# Patient Record
Sex: Female | Born: 2000 | Race: Black or African American | Hispanic: No | Marital: Single | Smoking: Never smoker
Health system: Southern US, Community
[De-identification: ages and names within clinical notes are randomized; demographics above are authoritative.]

## PROBLEM LIST (undated history)

## (undated) DIAGNOSIS — L309 Dermatitis, unspecified: Secondary | ICD-10-CM

## (undated) HISTORY — DX: Dermatitis, unspecified: L30.9

---
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
## 2021-01-17 DIAGNOSIS — Z419 Encounter for procedure for purposes other than remedying health state, unspecified: Secondary | ICD-10-CM | POA: Diagnosis not present

## 2021-02-16 DIAGNOSIS — Z419 Encounter for procedure for purposes other than remedying health state, unspecified: Secondary | ICD-10-CM | POA: Diagnosis not present

## 2021-03-19 DIAGNOSIS — Z419 Encounter for procedure for purposes other than remedying health state, unspecified: Secondary | ICD-10-CM | POA: Diagnosis not present

## 2021-04-19 DIAGNOSIS — Z419 Encounter for procedure for purposes other than remedying health state, unspecified: Secondary | ICD-10-CM | POA: Diagnosis not present

## 2021-05-04 ENCOUNTER — Other Ambulatory Visit: Payer: Self-pay

## 2021-05-04 ENCOUNTER — Encounter: Payer: Self-pay | Admitting: Nurse Practitioner

## 2021-05-04 ENCOUNTER — Ambulatory Visit (INDEPENDENT_AMBULATORY_CARE_PROVIDER_SITE_OTHER): Payer: Medicaid Other | Admitting: Nurse Practitioner

## 2021-05-04 VITALS — BP 118/71 | HR 99 | Temp 98.4°F | Ht 59.0 in | Wt 230.0 lb

## 2021-05-04 DIAGNOSIS — L309 Dermatitis, unspecified: Secondary | ICD-10-CM

## 2021-05-04 DIAGNOSIS — F322 Major depressive disorder, single episode, severe without psychotic features: Secondary | ICD-10-CM | POA: Diagnosis not present

## 2021-05-04 MED ORDER — SERTRALINE HCL 50 MG PO TABS: 50.0000 mg | ORAL_TABLET | Freq: Every day | ORAL | 3 refills | Status: AC

## 2021-05-04 MED ORDER — PREDNISONE 10 MG PO TABS: ORAL_TABLET | ORAL | 0 refills | Status: AC

## 2021-05-04 NOTE — Assessment & Plan Note (Signed)
-  PHQ-9 = 19 -Rx. Sertraline -referral to therapy

## 2021-05-04 NOTE — Progress Notes (Addendum)
Acute Office Visit  Subjective:    Patient ID: Robin Contreras, female    DOB: 05/21/2001, 20 y.o.   MRN: [id number]  Chief Complaint  Patient presents with   Rash    Chronic eczema. Has gotten worse over the past 3 months. Burns when she is in the shower.    Rash  Patient is in today for eczema. She states she is itching all over. She has plaques to her arms and back.  She also states that she is depressed. PHQ-9 = 19  Past Medical History:  Diagnosis Date   Eczema     History reviewed. No pertinent surgical history.  History reviewed. No pertinent family history.  Social History   Socioeconomic History   Marital status: Single    Spouse name: Not on file   Number of children: Not on file   Years of education: Not on file   Highest education level: Not on file  Occupational History   Not on file  Tobacco Use   Smoking status: Never   Smokeless tobacco: Never  Vaping Use   Vaping Use: Never used  Substance and Sexual Activity   Alcohol use: Never   Drug use: Never   Sexual activity: Never  Other Topics Concern   Not on file  Social History Narrative   Not on file   Social Determinants of Health   Financial Resource Strain: Not on file  Food Insecurity: Not on file  Transportation Needs: Not on file  Physical Activity: Not on file  Stress: Not on file  Social Connections: Not on file  Intimate Partner Violence: Not on file    Outpatient Medications Prior to Visit  Medication Sig Dispense Refill   ferrous sulfate 325 (65 FE) MG EC tablet Take 325 mg by mouth 2 (two) times daily.     ibuprofen (ADVIL) 600 MG tablet Take 1 tablet (600 mg total) by mouth every 8 (eight) hours as needed. 21 tablet 0   ondansetron (ZOFRAN ODT) 4 MG disintegrating tablet Take 1 tablet (4 mg total) by mouth every 8 (eight) hours as needed for nausea. (Patient not taking: Reported on 05/04/2021) 10 tablet 0   DULoxetine (CYMBALTA) 30 MG capsule TAKE 1 CAPSULE BY MOUTH EVERY  DAY (Patient not taking: Reported on 05/04/2021) 30 capsule 1   omeprazole (PRILOSEC) 20 MG capsule Take 1 capsule (20 mg total) by mouth daily. (Patient not taking: Reported on 05/04/2021) 30 capsule 0   triamcinolone (KENALOG) 0.025 % cream Apply 1 application topically 2 (two) times daily. (Patient not taking: Reported on 05/04/2021)     No facility-administered medications prior to visit.    Allergies  Allergen Reactions   Other     Bug bites and peas -unknown reaction   Pea Rash    Review of Systems  Constitutional: Negative.   Respiratory: Negative.    Cardiovascular: Negative.   Skin:  Positive for rash.       To bilateral arms and back  Psychiatric/Behavioral:  Positive for dysphoric mood. Negative for self-injury and suicidal ideas.       Objective:    Physical Exam Constitutional:      Appearance: Normal appearance. She is obese.  Skin:    Comments: -extensive rash to bilateral arms and back  Neurological:     Mental Status: She is alert.  Psychiatric:     Comments: =PHQ-9 = 19, indicating severe depression    BP 118/71 (BP Location: Left Arm, Patient Position: Sitting, Cuff  Size: Large)   Pulse 99   Temp 98.4 F (36.9 C) (Oral)   Ht 4\' 11"  (1.499 m)   Wt 230 lb (104.3 kg)   LMP 04/14/2021 (Exact Date)   SpO2 98%   BMI 46.45 kg/m  Wt Readings from Last 3 Encounters:  05/04/21 230 lb (104.3 kg) (99 %, Z= 2.31)*  11/03/20 216 lb (98 kg) (98 %, Z= 2.16)*  10/29/20 210 lb (95.3 kg) (98 %, Z= 2.09)*   * Growth percentiles are based on CDC (Girls, 2-20 Years) data.    Health Maintenance Due  Topic Date Due   HIV Screening  Never done   Hepatitis C Screening  Never done    There are no preventive care reminders to display for this patient.    Lab Results  Component Value Date   TSH 1.227 11/03/2020   Lab Results  Component Value Date   WBC 10.8 (H) 11/03/2020   HGB 10.5 (L) 11/03/2020   HCT 35.4 (L) 11/03/2020   MCV 72.0 (L) 11/03/2020   PLT  297 11/03/2020   Lab Results  Component Value Date   NA 139 11/03/2020   K 3.6 11/03/2020   CO2 21 (L) 11/03/2020   GLUCOSE 85 11/03/2020   BUN 10 11/03/2020   CREATININE 0.59 11/03/2020   BILITOT 0.5 11/03/2020   ALKPHOS 71 11/03/2020   AST 20 11/03/2020   ALT 14 11/03/2020   PROT 8.2 (H) 11/03/2020   ALBUMIN 4.4 11/03/2020   CALCIUM 9.5 11/03/2020   ANIONGAP 12 11/03/2020   Lab Results  Component Value Date   CHOL 176 (H) 08/10/2020   Lab Results  Component Value Date   HDL 63 08/10/2020   Lab Results  Component Value Date   LDLCALC 97 08/10/2020   Lab Results  Component Value Date   TRIG 86 08/10/2020   Lab Results  Component Value Date   CHOLHDL 2.8 08/10/2020   Lab Results  Component Value Date   HGBA1C 5.6 08/10/2020       Assessment & Plan:   Problem List Items Addressed This Visit       Musculoskeletal and Integument   Eczema - Primary    -severe -has been using her mom's triamcinolone ointment, but she has extensive rash/plaque to over 80% of her back and her arms as well -I don't think a topical steroid would be safe d/t risk of adrenal issues -urgent referral to derm -Rx. 12-day dose-pack of prednisone      Relevant Medications   predniSONE (DELTASONE) 10 MG tablet   Other Relevant Orders   Ambulatory referral to Dermatology     Other   Depression, major, single episode, severe (HCC)    -PHQ-9 = 19 -Rx. Sertraline -referral to therapy       Relevant Medications   sertraline (ZOLOFT) 50 MG tablet   Other Relevant Orders   Ambulatory referral to Psychiatry     Meds ordered this encounter  Medications   sertraline (ZOLOFT) 50 MG tablet    Sig: Take 1 tablet (50 mg total) by mouth daily.    Dispense:  30 tablet    Refill:  3   predniSONE (DELTASONE) 10 MG tablet    Sig: Take 6 tablets (60 mg total) by mouth daily with breakfast for 2 days, THEN 5 tablets (50 mg total) daily with breakfast for 2 days, THEN 4 tablets (40 mg  total) daily with breakfast for 2 days, THEN 3 tablets (30 mg total) daily with  breakfast for 2 days, THEN 2 tablets (20 mg total) daily with breakfast for 2 days, THEN 1 tablet (10 mg total) daily with breakfast for 2 days.    Dispense:  42 tablet    Refill:  0      Heather Roberts, NP

## 2021-05-04 NOTE — Assessment & Plan Note (Addendum)
-  severe -has been using her mom's triamcinolone ointment, but she has extensive rash/plaque to over 80% of her back and her arms as well -I don't think a topical steroid would be safe d/t risk of adrenal issues -urgent referral to derm -Rx. 12-day dose-pack of prednisone

## 2021-05-04 NOTE — Patient Instructions (Signed)
It is not uncommon to feel different/odd when starting sertraline, but this usually goes away within a week. Try to give the medicine a month to see if you have an improvement. If you have any suicidal thoughts or severe side effects, call the office immediately.

## 2021-05-04 NOTE — Addendum Note (Signed)
Addended by: Bjorn Pippin on: 05/04/2021 10:53 AM   Modules accepted: Orders

## 2021-05-15 ENCOUNTER — Ambulatory Visit (INDEPENDENT_AMBULATORY_CARE_PROVIDER_SITE_OTHER): Payer: Medicaid Other | Admitting: Licensed Clinical Social Worker

## 2021-05-15 ENCOUNTER — Other Ambulatory Visit: Payer: Self-pay

## 2021-05-15 DIAGNOSIS — F322 Major depressive disorder, single episode, severe without psychotic features: Secondary | ICD-10-CM

## 2021-05-18 NOTE — Progress Notes (Signed)
Left message encouraging contact 

## 2021-05-19 DIAGNOSIS — Z419 Encounter for procedure for purposes other than remedying health state, unspecified: Secondary | ICD-10-CM | POA: Diagnosis not present

## 2021-06-07 ENCOUNTER — Ambulatory Visit: Payer: Medicaid Other | Admitting: Internal Medicine

## 2021-06-12 ENCOUNTER — Ambulatory Visit (INDEPENDENT_AMBULATORY_CARE_PROVIDER_SITE_OTHER): Payer: Medicaid Other | Admitting: Licensed Clinical Social Worker

## 2021-06-12 ENCOUNTER — Other Ambulatory Visit: Payer: Self-pay

## 2021-06-12 ENCOUNTER — Encounter (INDEPENDENT_AMBULATORY_CARE_PROVIDER_SITE_OTHER): Payer: Self-pay

## 2021-06-12 DIAGNOSIS — Z91199 Patient's noncompliance with other medical treatment and regimen due to unspecified reason: Secondary | ICD-10-CM

## 2021-06-19 DIAGNOSIS — Z419 Encounter for procedure for purposes other than remedying health state, unspecified: Secondary | ICD-10-CM | POA: Diagnosis not present

## 2021-07-19 DIAGNOSIS — Z419 Encounter for procedure for purposes other than remedying health state, unspecified: Secondary | ICD-10-CM | POA: Diagnosis not present

## 2021-08-19 DIAGNOSIS — Z419 Encounter for procedure for purposes other than remedying health state, unspecified: Secondary | ICD-10-CM | POA: Diagnosis not present

## 2021-09-19 DIAGNOSIS — Z419 Encounter for procedure for purposes other than remedying health state, unspecified: Secondary | ICD-10-CM | POA: Diagnosis not present

## 2021-10-17 DIAGNOSIS — Z419 Encounter for procedure for purposes other than remedying health state, unspecified: Secondary | ICD-10-CM | POA: Diagnosis not present

## 2021-11-17 DIAGNOSIS — Z419 Encounter for procedure for purposes other than remedying health state, unspecified: Secondary | ICD-10-CM | POA: Diagnosis not present

## 2021-12-17 DIAGNOSIS — Z419 Encounter for procedure for purposes other than remedying health state, unspecified: Secondary | ICD-10-CM | POA: Diagnosis not present

## 2022-01-17 DIAGNOSIS — Z419 Encounter for procedure for purposes other than remedying health state, unspecified: Secondary | ICD-10-CM | POA: Diagnosis not present

## 2022-02-16 DIAGNOSIS — Z419 Encounter for procedure for purposes other than remedying health state, unspecified: Secondary | ICD-10-CM | POA: Diagnosis not present

## 2022-12-03 IMAGING — DX DG WRIST COMPLETE 3+V*R*
4 series · 4 of 4 positions shown · non-contrast
Comparison: None.

CLINICAL DATA: Bump on wrist, pain.  No known injury.

EXAM:
RIGHT WRIST - COMPLETE 3+ VIEW

[wrist pa]
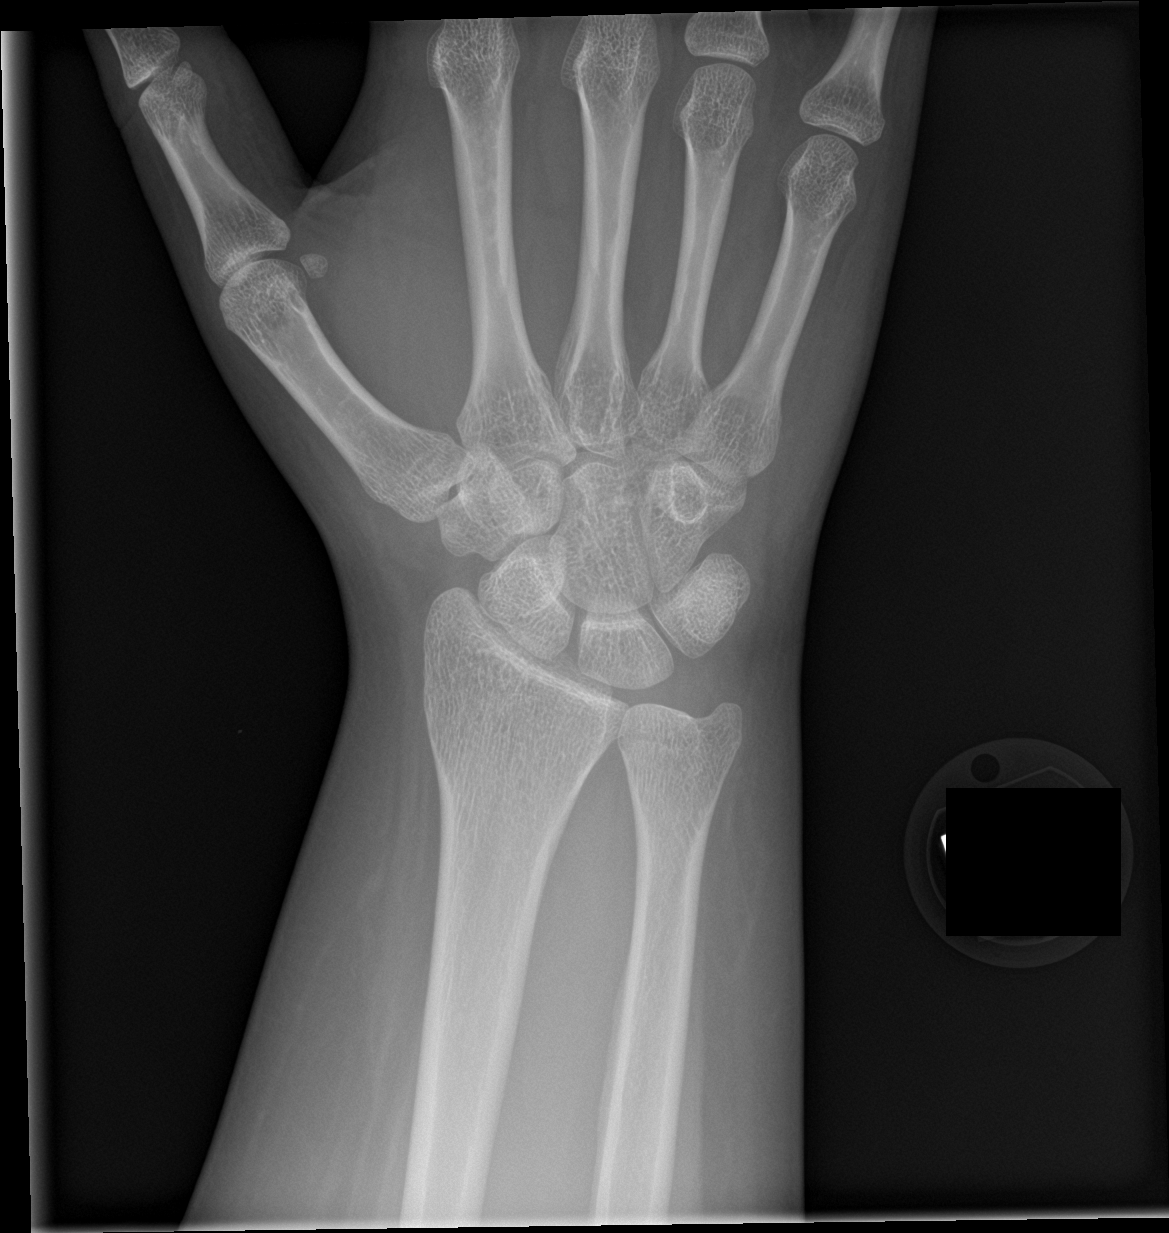

[wrist obl]
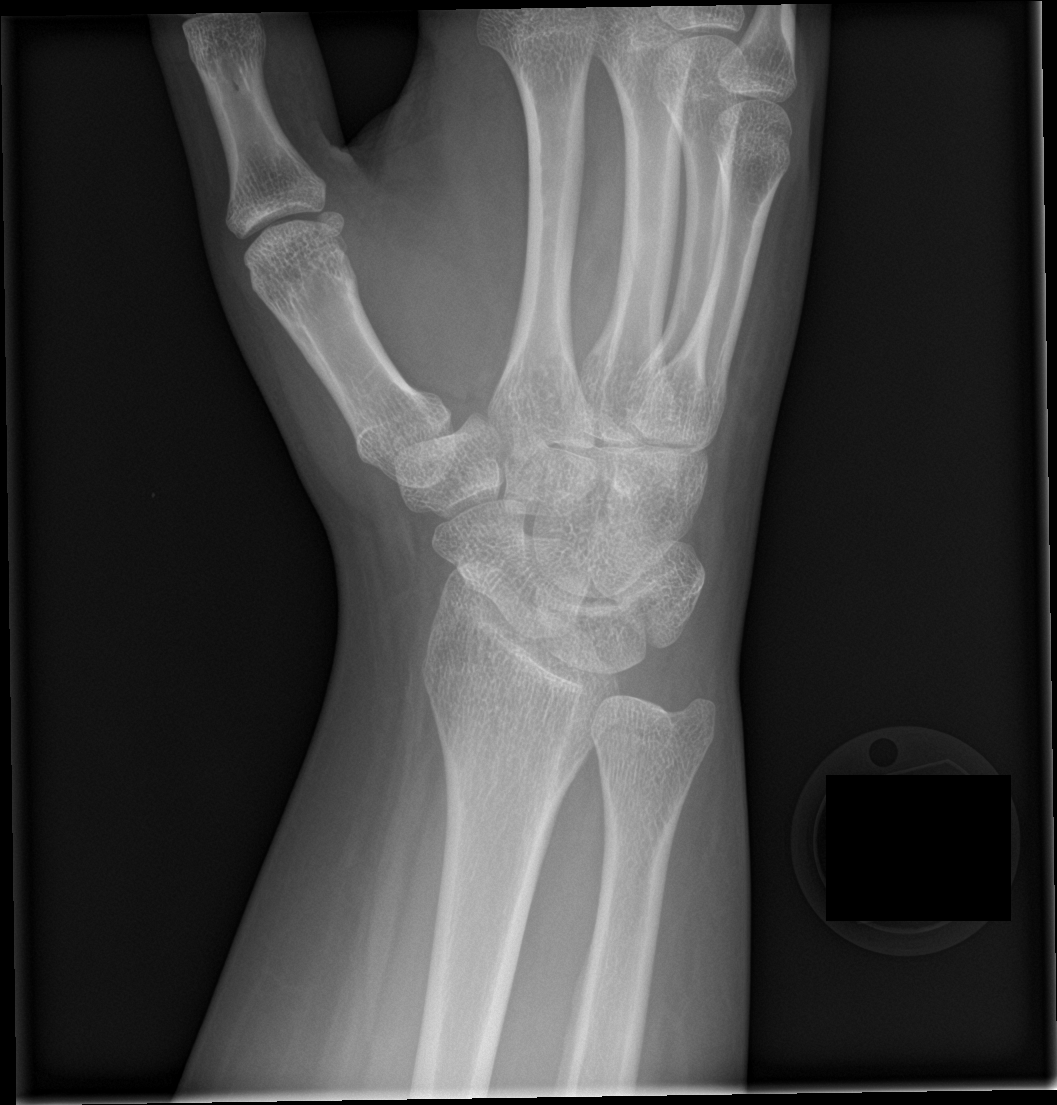

[wrist lat]
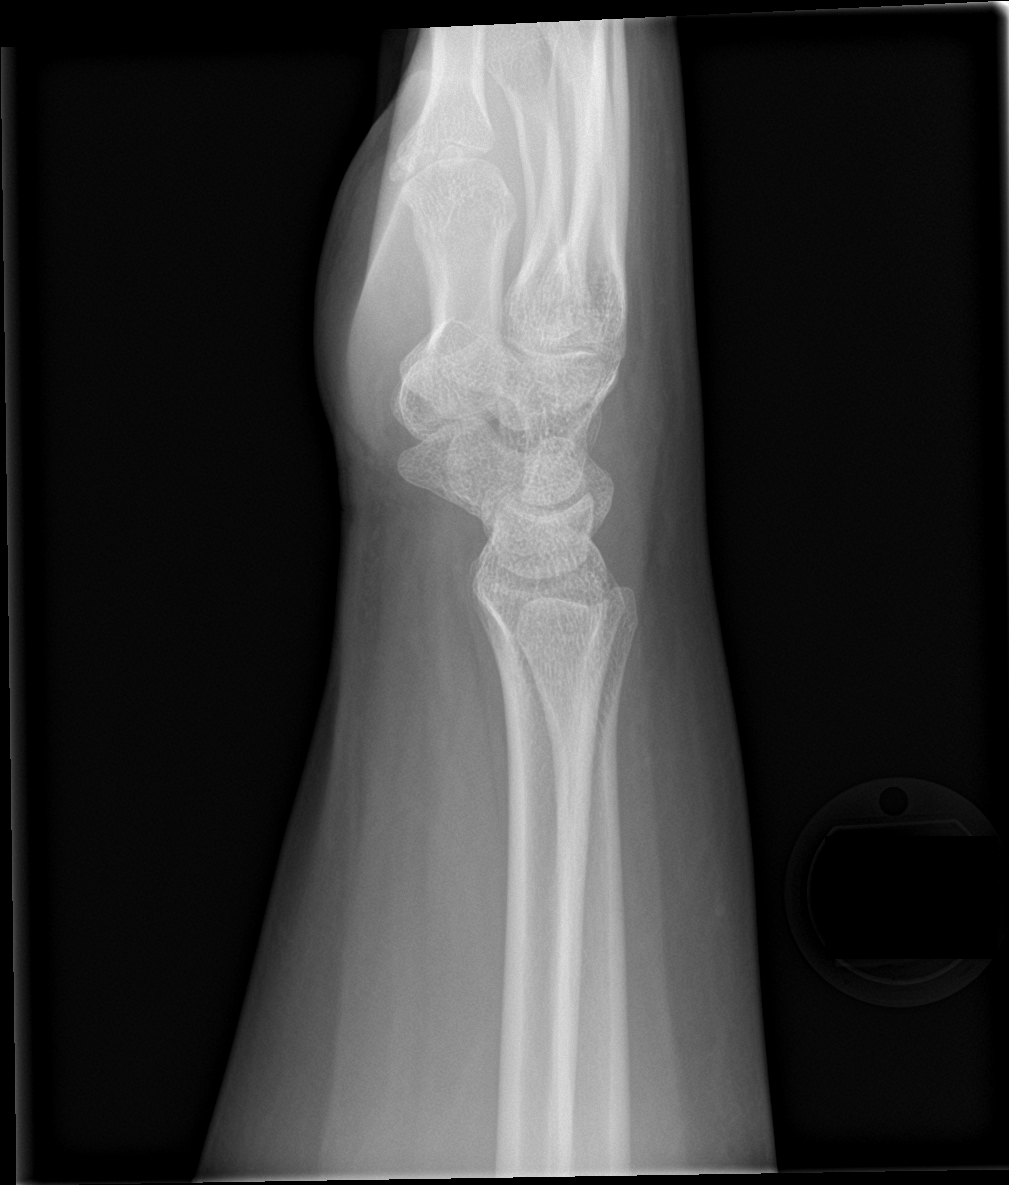

[wrist navicular]
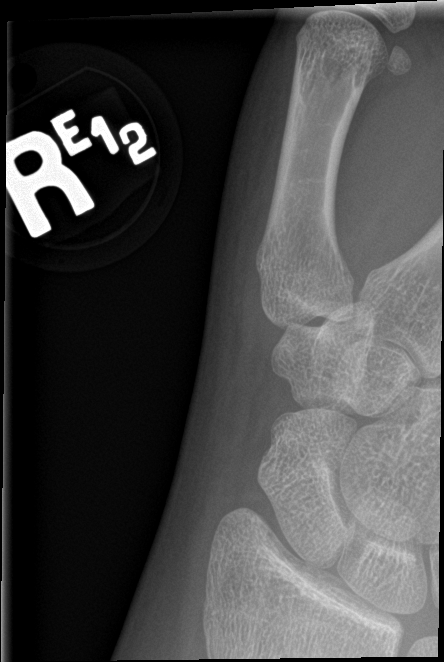

[4 of 4 positions shown; findings below may reference images not displayed]

FINDINGS: There is no evidence of fracture or dislocation. There is no
evidence of arthropathy or other focal bone abnormality. Soft
tissues are unremarkable.
IMPRESSION: Negative.
